# Patient Record
Sex: Female | Born: 1955 | Race: White | Hispanic: No | Marital: Married | Smoking: Never smoker
Health system: Southern US, Community
[De-identification: ages and names within clinical notes are randomized; demographics above are authoritative.]

## PROBLEM LIST (undated history)

## (undated) DIAGNOSIS — I10 Essential (primary) hypertension: Secondary | ICD-10-CM

## (undated) DIAGNOSIS — N809 Endometriosis, unspecified: Secondary | ICD-10-CM

## (undated) DIAGNOSIS — K219 Gastro-esophageal reflux disease without esophagitis: Secondary | ICD-10-CM

## (undated) HISTORY — PX: CHOLECYSTECTOMY: SHX55

## (undated) HISTORY — PX: LAPAROSCOPY: SHX197

---

## 2004-02-26 ENCOUNTER — Other Ambulatory Visit: Admission: RE | Admit: 2004-02-26 | Discharge: 2004-02-26 | Payer: Self-pay | Admitting: Obstetrics and Gynecology

## 2016-09-24 ENCOUNTER — Emergency Department (HOSPITAL_COMMUNITY)
Admission: EM | Admit: 2016-09-24 | Discharge: 2016-09-24 | Disposition: A | Discharge status: Home / Self Care | Payer: 59 | Attending: Emergency Medicine | Admitting: Emergency Medicine

## 2016-09-24 ENCOUNTER — Emergency Department (HOSPITAL_COMMUNITY): Payer: 59

## 2016-09-24 ENCOUNTER — Encounter (HOSPITAL_COMMUNITY): Payer: Self-pay | Admitting: Emergency Medicine

## 2016-09-24 DIAGNOSIS — R1013 Epigastric pain: Secondary | ICD-10-CM | POA: Insufficient documentation

## 2016-09-24 DIAGNOSIS — Z79899 Other long term (current) drug therapy: Secondary | ICD-10-CM | POA: Diagnosis not present

## 2016-09-24 DIAGNOSIS — I10 Essential (primary) hypertension: Secondary | ICD-10-CM | POA: Insufficient documentation

## 2016-09-24 HISTORY — DX: Endometriosis, unspecified: N80.9

## 2016-09-24 HISTORY — DX: Gastro-esophageal reflux disease without esophagitis: K21.9

## 2016-09-24 HISTORY — DX: Essential (primary) hypertension: I10

## 2016-09-24 LAB — CBC
HCT: 37.5 % (ref 36.0–46.0)
Hemoglobin: 12.8 g/dL (ref 12.0–15.0)
MCH: 29.8 pg (ref 26.0–34.0)
MCHC: 34.1 g/dL (ref 30.0–36.0)
MCV: 87.4 fL (ref 78.0–100.0)
Platelets: 238 10*3/uL (ref 150–400)
RBC: 4.29 MIL/uL (ref 3.87–5.11)
RDW: 13.7 % (ref 11.5–15.5)
WBC: 6.4 10*3/uL (ref 4.0–10.5)

## 2016-09-24 LAB — BASIC METABOLIC PANEL
Anion gap: 8 (ref 5–15)
BUN: 15 mg/dL (ref 6–20)
CHLORIDE: 110 mmol/L (ref 101–111)
CO2: 23 mmol/L (ref 22–32)
CREATININE: 0.64 mg/dL (ref 0.44–1.00)
Calcium: 9.2 mg/dL (ref 8.9–10.3)
GFR calc Af Amer: >60 mL/min (ref >60)
GFR calc non Af Amer: >60 mL/min (ref >60)
Glucose, Bld: 143 mg/dL — ABNORMAL HIGH (ref 65–99)
Potassium: 4.2 mmol/L (ref 3.5–5.1)
Sodium: 141 mmol/L (ref 135–145)

## 2016-09-24 LAB — DIFFERENTIAL
Basophils Absolute: 0 10*3/uL (ref 0.0–0.1)
Basophils Relative: 1 %
EOS ABS: 0.1 10*3/uL (ref 0.0–0.7)
EOS PCT: 2 %
LYMPHS ABS: 1.1 10*3/uL (ref 0.7–4.0)
Lymphocytes Relative: 19 %
Monocytes Absolute: 0.4 10*3/uL (ref 0.1–1.0)
Monocytes Relative: 6 %
NEUTROS PCT: 72 %
Neutro Abs: 4.3 10*3/uL (ref 1.7–7.7)

## 2016-09-24 LAB — HEPATIC FUNCTION PANEL
ALK PHOS: 85 U/L (ref 38–126)
ALT: 62 U/L — ABNORMAL HIGH (ref 14–54)
AST: 45 U/L — ABNORMAL HIGH (ref 15–41)
Albumin: 4.2 g/dL (ref 3.5–5.0)
BILIRUBIN TOTAL: 0.7 mg/dL (ref 0.3–1.2)
Bilirubin, Direct: <0.1 mg/dL — ABNORMAL LOW (ref 0.1–0.5)
Total Protein: 6.6 g/dL (ref 6.5–8.1)

## 2016-09-24 LAB — I-STAT TROPONIN, ED: Troponin i, poc: 0 ng/mL (ref 0.00–0.08)

## 2016-09-24 LAB — LIPASE, BLOOD: Lipase: 18 U/L (ref 11–51)

## 2016-09-24 MED ORDER — ONDANSETRON HCL 4 MG/2ML IJ SOLN
4.0000 mg | Freq: Once | INTRAMUSCULAR | Status: AC
  Administered 2016-09-24: 4 mg via INTRAVENOUS

## 2016-09-24 MED ORDER — ONDANSETRON HCL 4 MG/2ML IJ SOLN
4.0000 mg | Freq: Once | INTRAMUSCULAR | Status: DC | PRN
  Filled 2016-09-24: qty 2

## 2016-09-24 MED ORDER — PANTOPRAZOLE SODIUM 40 MG IV SOLR
40.0000 mg | Freq: Once | INTRAVENOUS | Status: AC
  Administered 2016-09-24: 40 mg via INTRAVENOUS
  Filled 2016-09-24: qty 40

## 2016-09-24 MED ORDER — MORPHINE SULFATE (PF) 4 MG/ML IV SOLN
4.0000 mg | Freq: Once | INTRAVENOUS | Status: AC
  Administered 2016-09-24: 4 mg via INTRAVENOUS
  Filled 2016-09-24: qty 1

## 2016-09-24 MED ORDER — ONDANSETRON HCL 4 MG/2ML IJ SOLN
4.0000 mg | Freq: Once | INTRAMUSCULAR | Status: AC
  Administered 2016-09-24: 4 mg via INTRAVENOUS
  Filled 2016-09-24: qty 2

## 2016-09-24 MED ORDER — GI COCKTAIL ~~LOC~~
30.0000 mL | Freq: Once | ORAL | Status: AC
  Administered 2016-09-24: 30 mL via ORAL
  Filled 2016-09-24: qty 30

## 2016-09-24 MED ORDER — ONDANSETRON 4 MG PO TBDP: ORAL_TABLET | ORAL | 0 refills | Status: AC

## 2016-09-24 MED ORDER — SODIUM CHLORIDE 0.9 % IV BOLUS (SEPSIS)
1000.0000 mL | Freq: Once | INTRAVENOUS | Status: AC
  Administered 2016-09-24: 1000 mL via INTRAVENOUS

## 2016-09-24 NOTE — ED Provider Notes (Signed)
[facility] DEPT Provider Note   CSN: [id number] Arrival date & time: 09/24/16  1017     History   Chief Complaint Chief Complaint  Patient presents with  . Abdominal Pain  . Chest Pain    HPI Debra Dudley is a 61 y.o. female.  61 yo F with a chief complaint of epigastric abdominal pain that radiates to the neck. Going on for the past 6 hours. Initially she thought this was reflux and tried different medications without improvement. Denies exertional symptoms denies shortness of breath denies diaphoresis. Has had some nausea and vomiting. Denies lower extremity edema. Recently had a plane flight yesterday that was just to [place]. Denies recent hospitalization or surgery. Denies history of cancer. Denies history of PE or DVT. Denies hemoptysis. History of hyperlipidemia denies hypertension diabetes smoking or family history of MI.   The history is provided by the patient and the spouse.  Abdominal Pain   This is a new problem. The current episode started 6 to 12 hours ago. The problem occurs constantly. The problem has not changed since onset.The pain is located in the epigastric region. The quality of the pain is sharp, shooting and pressure-like. The pain is at a severity of 10/10. The pain is severe. Associated symptoms include nausea and vomiting. Pertinent negatives include fever, dysuria, headaches, arthralgias and myalgias. Nothing aggravates the symptoms. Nothing relieves the symptoms.  Chest Pain   Associated symptoms include abdominal pain, nausea and vomiting. Pertinent negatives include no dizziness, no fever, no headaches, no palpitations and no shortness of breath.    Past Medical History:  Diagnosis Date  . Endometriosis   . GERD (gastroesophageal reflux disease)   . Hypertension     There are no active problems to display for this patient.   Past Surgical History:  Procedure Laterality Date  . CHOLECYSTECTOMY    . LAPAROSCOPY      OB History    No  data available       Home Medications    Prior to Admission medications   Medication Sig Start Date End Date Taking? Authorizing Provider  amphetamine-dextroamphetamine (ADDERALL) 10 MG tablet Take 10 mg by mouth daily. 07/19/16  Yes [provider]  lisinopril (PRINIVIL,ZESTRIL) 10 MG tablet Take 10 mg by mouth daily. 07/19/16  Yes [provider]  ondansetron (ZOFRAN ODT) 4 MG disintegrating tablet 4mg  ODT q4 hours prn nausea/vomit 09/24/16   Deno Etienne, DO    Family History Family History  Problem Relation Age of Onset  . Diabetes Father     Social History Social History  Substance Use Topics  . Smoking status: Never Smoker  . Smokeless tobacco: Not on file  . Alcohol use Yes     Comment: occ     Allergies   Patient has no allergy information on record.   Review of Systems Review of Systems  Constitutional: Negative for chills and fever.  HENT: Negative for congestion and rhinorrhea.   Eyes: Negative for redness and visual disturbance.  Respiratory: Negative for shortness of breath and wheezing.   Cardiovascular: Negative for chest pain and palpitations.  Gastrointestinal: Positive for abdominal pain, nausea and vomiting.  Genitourinary: Negative for dysuria and urgency.  Musculoskeletal: Negative for arthralgias and myalgias.  Skin: Negative for pallor and wound.  Neurological: Negative for dizziness and headaches.     Physical Exam Updated Vital Signs BP 137/85   Pulse 69   Temp 97.8 F (36.6 C) (Oral)   Resp (!) 24  Ht 5\' 3"  (1.6 m)   Wt 87.1 kg (192 lb)   SpO2 91%   BMI 34.01 kg/m   Physical Exam  Constitutional: She is oriented to person, place, and time. She appears well-developed and well-nourished. No distress.  HENT:  Head: Normocephalic and atraumatic.  Eyes: EOM are normal. Pupils are equal, round, and reactive to light.  Neck: Normal range of motion. Neck supple.  Cardiovascular: Normal rate and regular rhythm.  Exam  reveals no gallop and no friction rub.   No murmur heard. Pulmonary/Chest: Effort normal. She has no wheezes. She has no rales.  Abdominal: Soft. She exhibits no distension and no mass. There is tenderness (epigastric). There is no guarding.  Musculoskeletal: She exhibits no edema or tenderness.  Neurological: She is alert and oriented to person, place, and time.  Skin: Skin is warm and dry. She is not diaphoretic.  Psychiatric: She has a normal mood and affect. Her behavior is normal.  Nursing note and vitals reviewed.    ED Treatments / Results  Labs (all labs ordered are listed, but only abnormal results are displayed) Labs Reviewed  BASIC METABOLIC PANEL - Abnormal; Notable for the following:       Result Value   Glucose, Bld 143 (*)    All other components within normal limits  HEPATIC FUNCTION PANEL - Abnormal; Notable for the following:    AST 45 (*)    ALT 62 (*)    Bilirubin, Direct <0.1 (*)    All other components within normal limits  LIPASE, BLOOD  CBC  DIFFERENTIAL  I-STAT TROPOININ, ED    EKG  EKG Interpretation  Date/Time:  Thursday September 24 2016 10:29:07 EDT Ventricular Rate:  67 PR Interval:    QRS Duration: 96 QT Interval:  400 QTC Calculation: 423 R Axis:   45 Text Interpretation:  Sinus rhythm Borderline short PR interval No old tracing to compare Confirmed by Deno Etienne [phone]) on 09/24/2016 10:56:36 AM       Radiology Dg Chest 2 View  Result Date: 09/24/2016 CLINICAL DATA:  Chest pain and shortness of breath EXAM: CHEST  2 VIEW COMPARISON:  None. FINDINGS: Lungs are clear. Heart size and pulmonary vascularity are normal. No adenopathy. No pneumothorax. No bone lesions. IMPRESSION: No edema or consolidation. Electronically Signed   By: Lowella Grip III M.D.   On: 09/24/2016 12:09    Procedures Procedures (including critical care time)  Medications Ordered in ED Medications  gi cocktail (Maalox,Lidocaine,Donnatal) (30 mLs Oral Given  09/24/16 1142)  sodium chloride 0.9 % bolus 1,000 mL (0 mLs Intravenous Stopped 09/24/16 1400)  ondansetron (ZOFRAN) injection 4 mg (4 mg Intravenous Given 09/24/16 1142)  pantoprazole (PROTONIX) injection 40 mg (40 mg Intravenous Given 09/24/16 1249)  morphine 4 MG/ML injection 4 mg (4 mg Intravenous Given 09/24/16 1257)  ondansetron (ZOFRAN) injection 4 mg (4 mg Intravenous Given 09/24/16 1254)     Initial Impression / Assessment and Plan / ED Course  I have reviewed the triage vital signs and the nursing notes.  Pertinent labs & imaging results that were available during my care of the patient were reviewed by me and considered in my medical decision making (see chart for details).     61 yo F with A chief complaint of epigastric abdominal pain. No right upper quadrant tenderness on my exam. History of cholecystectomy. Most likely gastric in nature. Chest pain rule out ordered in triage. Troponin negative EKG unremarkable. Feel she is low risk for  PE as well as she has no shortness of breath with the symptoms. Will obtain abdominal labs give a GI cocktail and reassess.  No improvement with GI cocktail. Patient was given morphine and Zofran some improvement. Labs reassuring. I discussed the results with the patient. Will do an oral trial.  Able to tolerate.    Patient is an anesthesiologist.  As she is still having pain discussed utility of CT.  After some discussion patient electing for discharge at this time.  Given GI follow up.     I have discussed the diagnosis/risks/treatment options with the patient and family and believe the pt to be eligible for discharge home to follow-up with PCP, GI. We also discussed returning to the ED immediately if new or worsening sx occur. We discussed the sx which are most concerning (e.g., sudden worsening pain, fever, inability to tolerate by mouth) that necessitate immediate return. Medications administered to the patient during their visit and any new  prescriptions provided to the patient are listed below.  Medications given during this visit Medications  gi cocktail (Maalox,Lidocaine,Donnatal) (30 mLs Oral Given 09/24/16 1142)  sodium chloride 0.9 % bolus 1,000 mL (0 mLs Intravenous Stopped 09/24/16 1400)  ondansetron (ZOFRAN) injection 4 mg (4 mg Intravenous Given 09/24/16 1142)  pantoprazole (PROTONIX) injection 40 mg (40 mg Intravenous Given 09/24/16 1249)  morphine 4 MG/ML injection 4 mg (4 mg Intravenous Given 09/24/16 1257)  ondansetron (ZOFRAN) injection 4 mg (4 mg Intravenous Given 09/24/16 1254)     The patient appears reasonably screen and/or stabilized for discharge and I doubt any other medical condition or other [facility] requiring further screening, evaluation, or treatment in the ED at this time prior to discharge.    Final Clinical Impressions(s) / ED Diagnoses   Final diagnoses:  Epigastric abdominal pain    New Prescriptions Discharge Medication List as of 09/24/2016  2:33 PM       Deno Etienne, DO 09/25/16 [id number]

## 2016-09-24 NOTE — Discharge Instructions (Signed)
Try zantac 150mg  twice a day.   Try to avoid foods that make this worse(spicy food, tomato based products, fatty foods, chocolate and peppermint)  A Gi doc is the only way to see if you have an ulcer.  [facility] is on call today and their number is in this paperwork.

## 2016-09-24 NOTE — ED Triage Notes (Signed)
Pt reports pressure on mid -low chest, upper abdomen and pressure on r/side of neck. Pain started at 5am, waking the pt. Husband at bedside. C/o NVD at 6am. Pt is alert, oriented, cooperative. Hx of GERD, tx with Prilosec, pepto bismol without relief

## 2016-10-19 ENCOUNTER — Other Ambulatory Visit: Payer: Self-pay | Admitting: Gastroenterology

## 2016-10-19 DIAGNOSIS — D3A8 Other benign neuroendocrine tumors: Secondary | ICD-10-CM

## 2016-10-20 ENCOUNTER — Ambulatory Visit
Admission: RE | Admit: 2016-10-20 | Discharge: 2016-10-20 | Disposition: A | Discharge status: Home / Self Care | Payer: 59 | Source: Ambulatory Visit | Attending: Gastroenterology | Admitting: Gastroenterology

## 2016-10-20 DIAGNOSIS — D3A8 Other benign neuroendocrine tumors: Secondary | ICD-10-CM

## 2016-10-20 MED ORDER — IOPAMIDOL (ISOVUE-300) INJECTION 61%
100.0000 mL | Freq: Once | INTRAVENOUS | Status: AC | PRN
  Administered 2016-10-20: 100 mL via INTRAVENOUS

## 2016-10-26 ENCOUNTER — Other Ambulatory Visit (HOSPITAL_COMMUNITY): Payer: Self-pay | Admitting: Gastroenterology

## 2016-10-26 DIAGNOSIS — D3A00 Benign carcinoid tumor of unspecified site: Secondary | ICD-10-CM

## 2016-11-04 ENCOUNTER — Encounter (HOSPITAL_COMMUNITY)
Admission: RE | Admit: 2016-11-04 | Discharge: 2016-11-04 | Disposition: A | Discharge status: Home / Self Care | Payer: 59 | Source: Ambulatory Visit | Attending: Gastroenterology | Admitting: Gastroenterology

## 2016-11-04 DIAGNOSIS — D3A00 Benign carcinoid tumor of unspecified site: Secondary | ICD-10-CM | POA: Insufficient documentation

## 2016-11-04 MED ORDER — GALLIUM GA 68 DOTATATE IV KIT
4.6000 | PACK | Freq: Once | INTRAVENOUS | Status: AC
Start: 2016-11-04 — End: 2016-11-04
  Administered 2016-11-04: 4.6 via INTRAVENOUS

## 2016-11-06 ENCOUNTER — Encounter (HOSPITAL_COMMUNITY): Payer: 59

## 2018-02-14 IMAGING — PT NM PET NOPR SKULL BASE TO THIGH
1 of 9 series · 1 of 25 positions shown · non-contrast
Comparison: CT 10/20/2016

CLINICAL DATA: Carcinoid tumor of the duodenum.

EXAM:
NUCLEAR MEDICINE PET SKULL BASE TO THIGH
TECHNIQUE: 4.6 mCi Ga 68 DOTATATE was injected intravenously. Full-ring PET
imaging was performed from the skull base to thigh after the
radiotracer. CT data was obtained and used for attenuation
correction and anatomic localization.

[Series 4: ct sk_thigh 5.0 hd_fov · axial · 5.0mm · 1.07mm/px · 1 of 221 slices shown]
[im 221/221  brain]
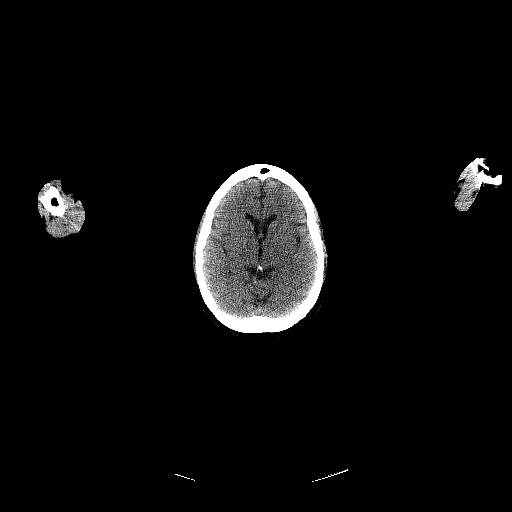

[1 of 25 positions shown; findings below may reference images not displayed]

FINDINGS: NECK

No radiotracer activity in neck lymph nodes.

CHEST

No radiotracer accumulation within mediastinal or hilar lymph nodes.
No suspicious pulmonary nodules on the CT scan.

ABDOMEN/PELVIS

Intense radiotracer activity localizes to the the proximal duodenum
mass with SUV max equal 78.1. Intense radiotracer activity
associated with the adjacent paraduodenal lymph node measuring 14 mm
short axis (107, series 4 with SUV max 98).

Several discrete foci of intense radiotracer activity within liver
consistent with metastasis. Largest lesion central LEFT hepatic lobe
with SUV max of 62. Smaller lesions are present within the dome the
RIGHT hepatic lobe LEFT lateral hepatic lobe. Approximately 6 to 8
lesions. Of note there is misregistration between the CT and PET
data from breathing as noted in the dome the liver.

No additional activity present within the bowel. No additional
metastatic lymph nodes present.

Physiologic activity noted in the spleen, adrenal glands and
kidneys.

SKELETON

No focal activity to suggest skeletal metastasis.
IMPRESSION: 1. Focal radiotracer accumulation within proximal duodenum mass
consistent with well differentiated neuroendocrine tumor.
2. Local metastatic lymph node along the second portion duodenum.
3. Hepatic metastasis with approximately 6 to 8 lesions which
accumulate the somatostatin receptor radiotracer consistent with
neuroendocrine tumor metastasis.

## 2019-10-24 ENCOUNTER — Ambulatory Visit: Payer: 59 | Admitting: Oncology

## 2020-05-30 ENCOUNTER — Ambulatory Visit (HOSPITAL_COMMUNITY)
Admission: RE | Admit: 2020-05-30 | Discharge: 2020-05-30 | Disposition: A | Discharge status: Home / Self Care | Payer: BLUE CROSS/BLUE SHIELD | Source: Ambulatory Visit | Attending: Nurse Practitioner | Admitting: Nurse Practitioner

## 2020-05-30 ENCOUNTER — Other Ambulatory Visit: Payer: Self-pay

## 2020-05-30 ENCOUNTER — Other Ambulatory Visit (HOSPITAL_COMMUNITY): Payer: Self-pay | Admitting: Nurse Practitioner

## 2020-05-30 DIAGNOSIS — C7B8 Other secondary neuroendocrine tumors: Secondary | ICD-10-CM | POA: Insufficient documentation

## 2020-12-05 DEATH — deceased

## 2021-09-09 IMAGING — DX DG CHEST 2V
2 series · 2 of 2 positions shown · non-contrast
Comparison: Chest x-ray from September 24, 2016

CLINICAL DATA: Patient on targeted therapy for lung cancer,
worsening cough.

EXAM:
CHEST - 2 VIEW

[chest pa]
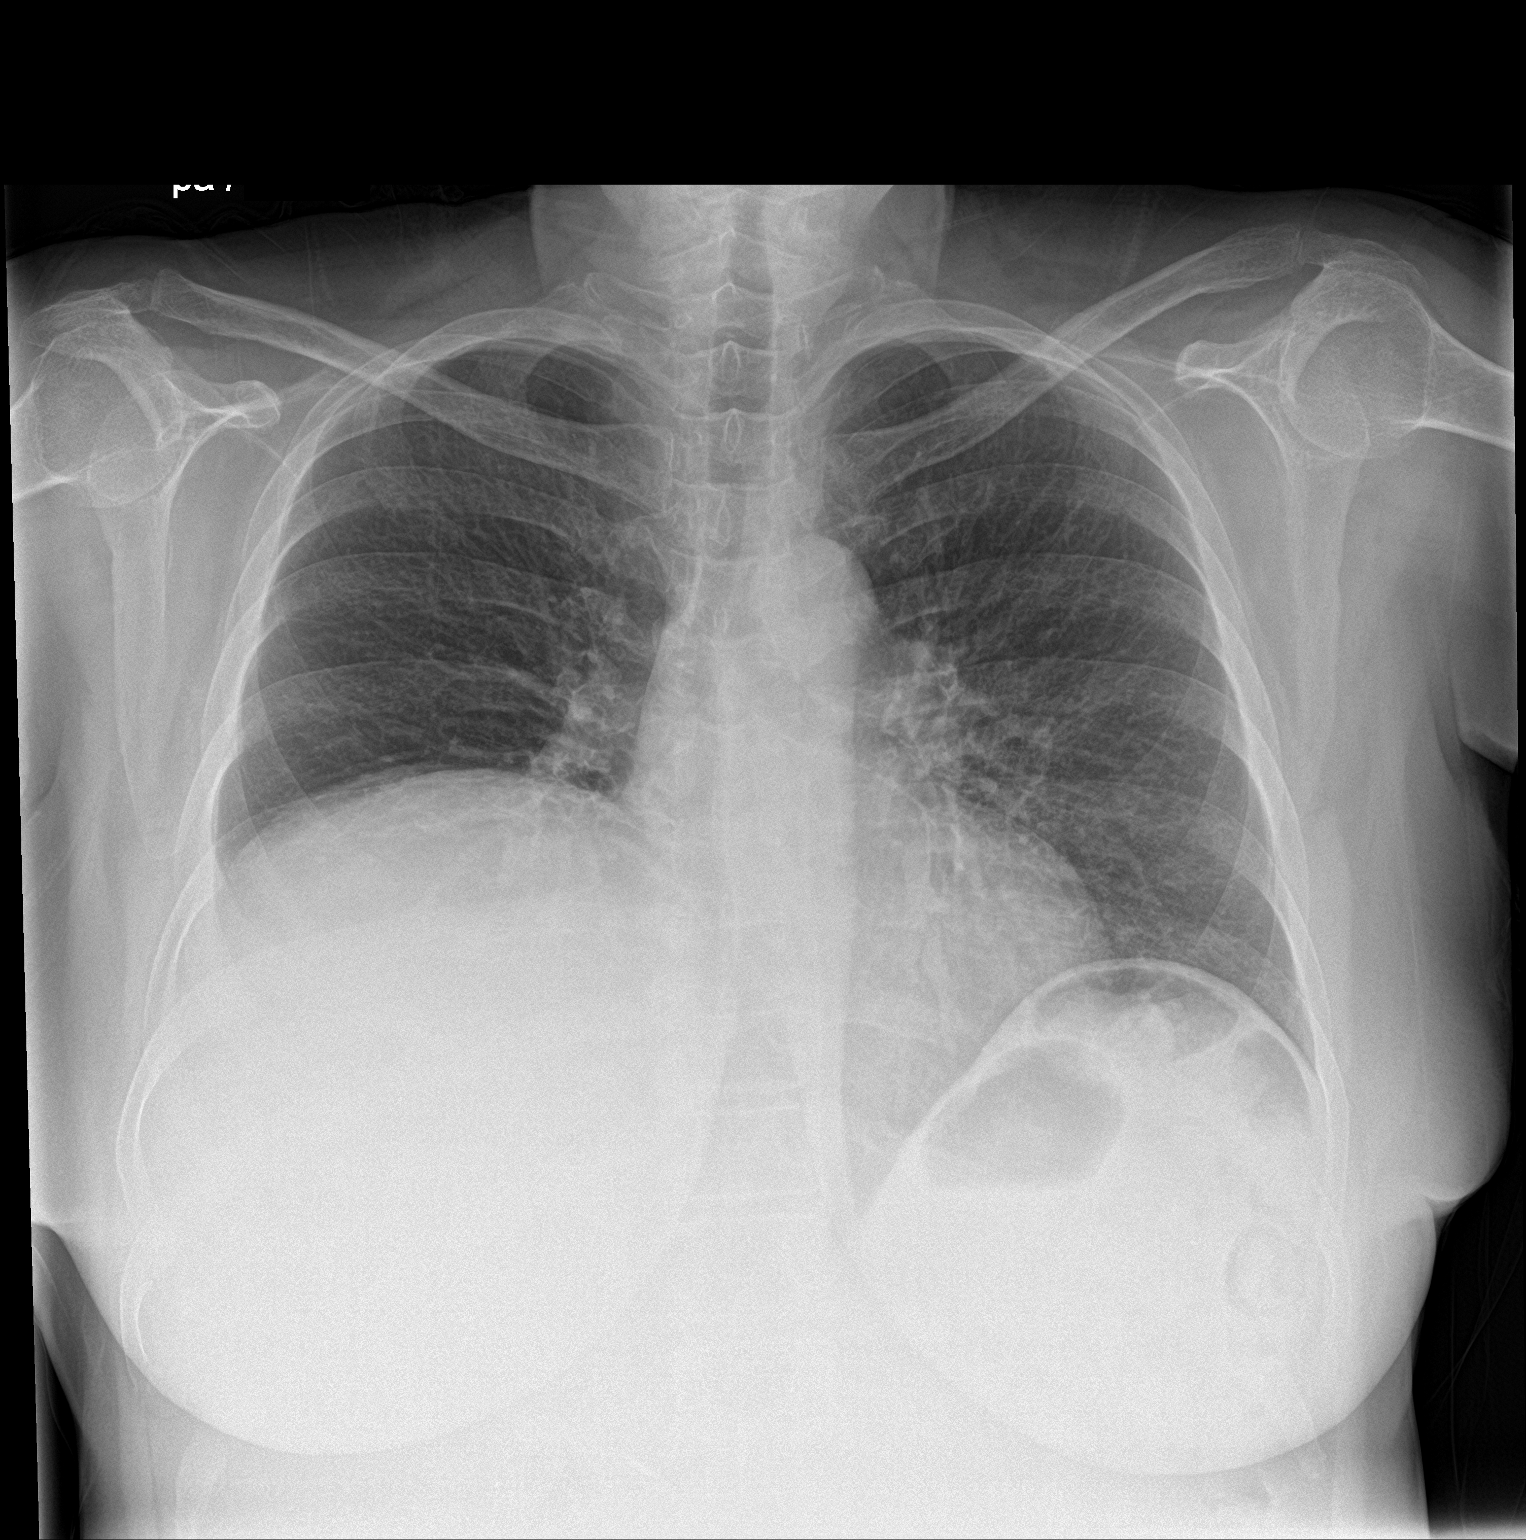

[chest lat]
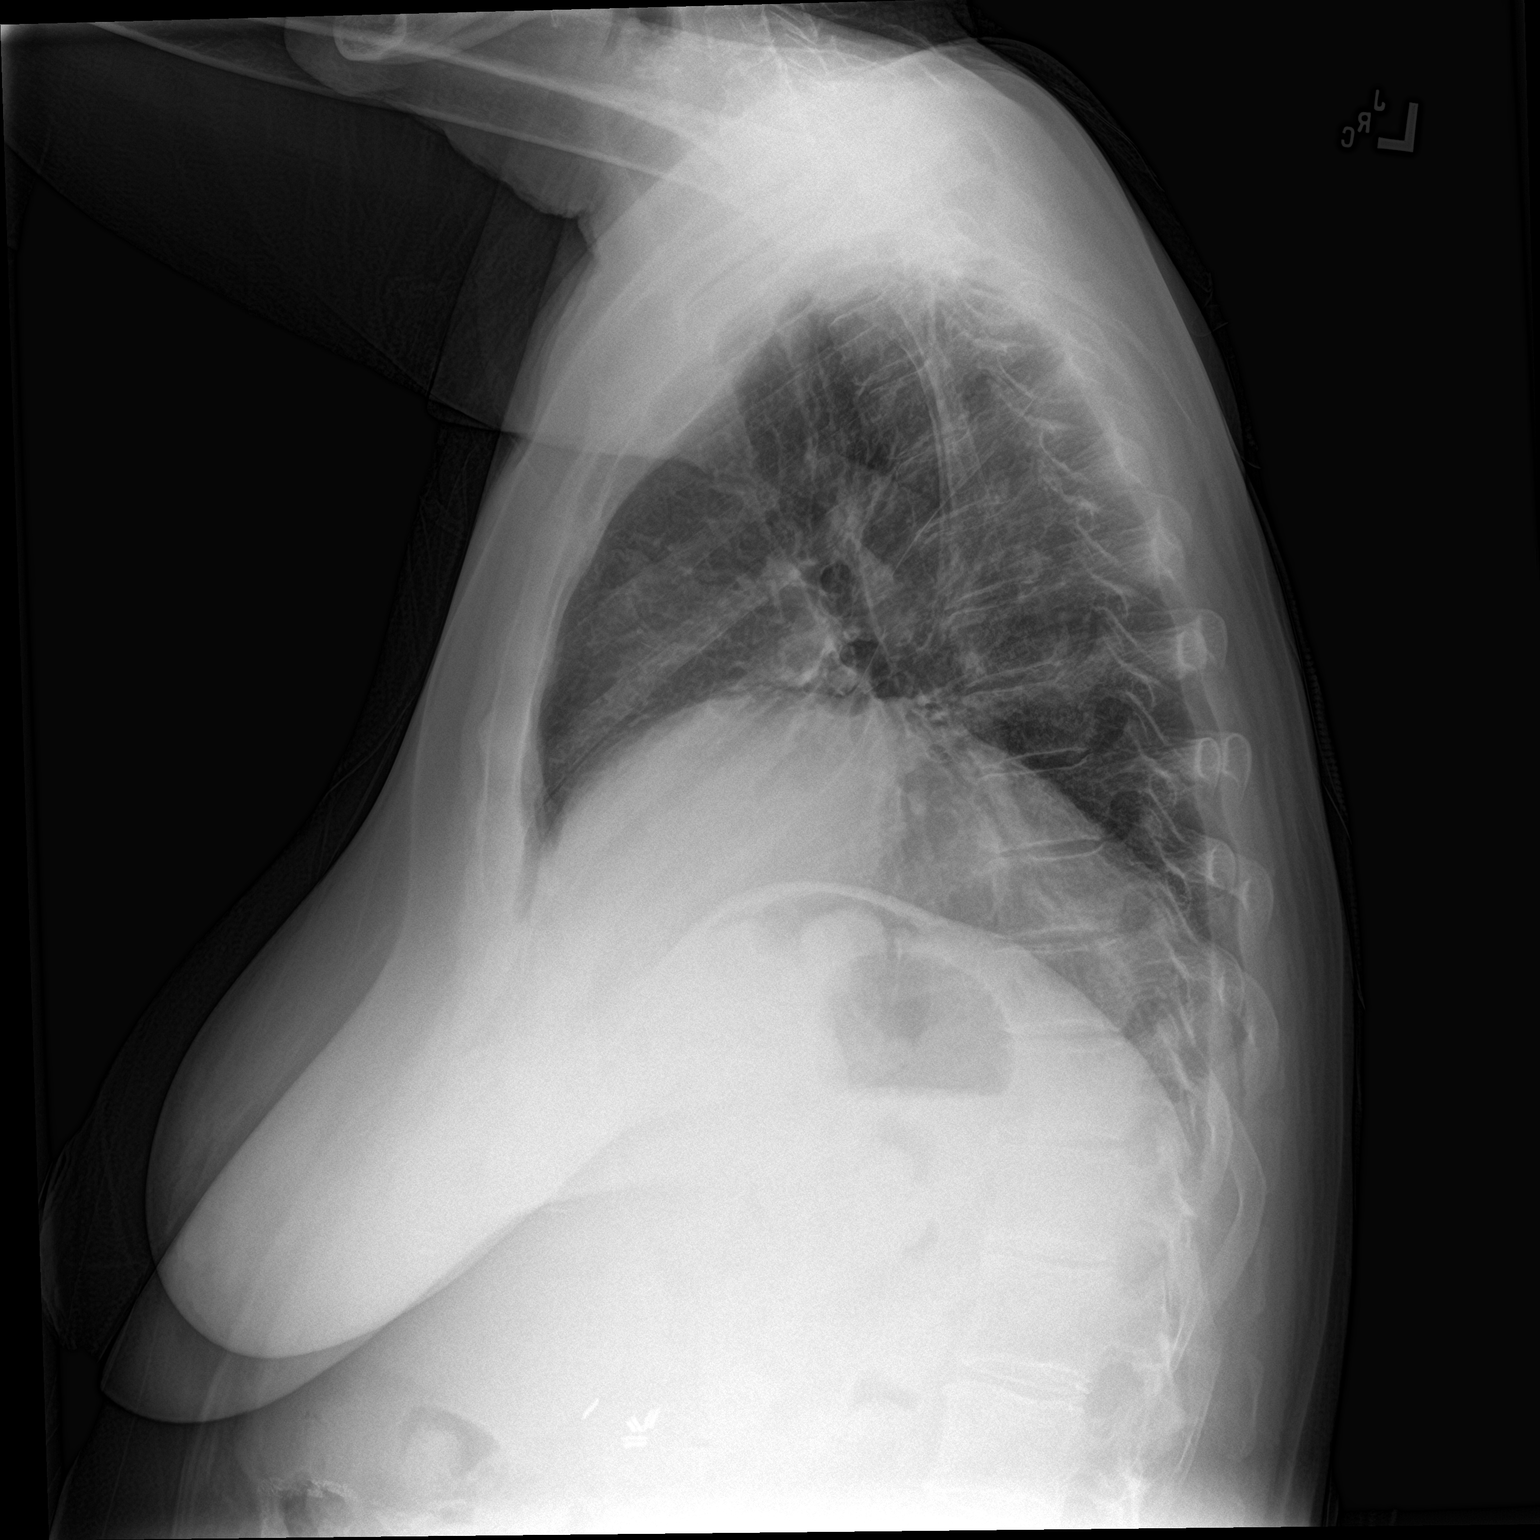

[2 of 2 positions shown; findings below may reference images not displayed]

FINDINGS: Trachea midline. Cardiomediastinal contours and hilar structures are
stable.

Elevation of the RIGHT hemidiaphragm is a new finding compared to
previous imaging. No sign of effusion. Juxta diaphragmatic airspace
disease subtle seen on lateral view likely atelectatic.

On limited assessment no acute skeletal process.
IMPRESSION: 1. New elevation of the RIGHT hemidiaphragm. This is of uncertain
significance in a patient with history of neoplasm of the chest but
no reported history of prior surgery in the chest. Findings may
relate to diaphragmatic dysfunction which can be seen in the setting
of mediastinal or cervical involvement of the phrenic nerve.
Comparison with previous imaging not available could also be helpful
as the most recent prior is from [id number] to determine whether this is a
chronic or new finding.
2. Juxta diaphragmatic airspace disease in the RIGHT lung base
subtle on lateral view, likely atelectatic.
